# Patient Record
Sex: Female | Born: 1990 | Race: Asian | Hispanic: No | Marital: Single | Smoking: Never smoker
Health system: Southern US, Community
[De-identification: ages and names within clinical notes are randomized; demographics above are authoritative.]

---

## 2016-05-01 ENCOUNTER — Encounter (HOSPITAL_COMMUNITY): Payer: Self-pay | Admitting: Emergency Medicine

## 2016-05-01 ENCOUNTER — Emergency Department (HOSPITAL_COMMUNITY)
Admission: EM | Admit: 2016-05-01 | Discharge: 2016-05-01 | Disposition: A | Discharge status: Home / Self Care | Payer: No Typology Code available for payment source | Attending: Emergency Medicine | Admitting: Emergency Medicine

## 2016-05-01 ENCOUNTER — Emergency Department (HOSPITAL_COMMUNITY): Payer: No Typology Code available for payment source

## 2016-05-01 DIAGNOSIS — Y939 Activity, unspecified: Secondary | ICD-10-CM | POA: Diagnosis not present

## 2016-05-01 DIAGNOSIS — Y92410 Unspecified street and highway as the place of occurrence of the external cause: Secondary | ICD-10-CM | POA: Insufficient documentation

## 2016-05-01 DIAGNOSIS — Y999 Unspecified external cause status: Secondary | ICD-10-CM | POA: Diagnosis not present

## 2016-05-01 DIAGNOSIS — S299XXA Unspecified injury of thorax, initial encounter: Secondary | ICD-10-CM | POA: Insufficient documentation

## 2016-05-01 DIAGNOSIS — R0789 Other chest pain: Secondary | ICD-10-CM

## 2016-05-01 LAB — PREGNANCY, URINE: Preg Test, Ur: NEGATIVE
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
## 2016-05-01 NOTE — ED Notes (Signed)
Patient transported to X-ray 
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
